# Patient Record
Sex: Female | Born: 1975 | Race: Black or African American | Hispanic: No | Marital: Single | Smoking: Never smoker
Health system: Southern US, Community
[De-identification: ages and names within clinical notes are randomized; demographics above are authoritative.]

## PROBLEM LIST (undated history)

## (undated) DIAGNOSIS — I10 Essential (primary) hypertension: Secondary | ICD-10-CM

## (undated) DIAGNOSIS — F32A Depression, unspecified: Secondary | ICD-10-CM

## (undated) DIAGNOSIS — F329 Major depressive disorder, single episode, unspecified: Secondary | ICD-10-CM

## (undated) DIAGNOSIS — F419 Anxiety disorder, unspecified: Secondary | ICD-10-CM

## (undated) HISTORY — DX: Anxiety disorder, unspecified: F41.9

## (undated) HISTORY — DX: Depression, unspecified: F32.A

## (undated) HISTORY — DX: Major depressive disorder, single episode, unspecified: F32.9

---

## 2014-02-11 ENCOUNTER — Emergency Department (HOSPITAL_COMMUNITY): Payer: Self-pay

## 2014-02-11 ENCOUNTER — Encounter (HOSPITAL_COMMUNITY): Payer: Self-pay | Admitting: Emergency Medicine

## 2014-02-11 ENCOUNTER — Emergency Department (HOSPITAL_COMMUNITY)
Admission: EM | Admit: 2014-02-11 | Discharge: 2014-02-11 | Disposition: A | Discharge status: Home / Self Care | Payer: Self-pay | Attending: Emergency Medicine | Admitting: Emergency Medicine

## 2014-02-11 DIAGNOSIS — R0602 Shortness of breath: Secondary | ICD-10-CM | POA: Insufficient documentation

## 2014-02-11 DIAGNOSIS — R112 Nausea with vomiting, unspecified: Secondary | ICD-10-CM | POA: Insufficient documentation

## 2014-02-11 DIAGNOSIS — R079 Chest pain, unspecified: Secondary | ICD-10-CM | POA: Insufficient documentation

## 2014-02-11 DIAGNOSIS — I10 Essential (primary) hypertension: Secondary | ICD-10-CM | POA: Insufficient documentation

## 2014-02-11 DIAGNOSIS — Z79899 Other long term (current) drug therapy: Secondary | ICD-10-CM | POA: Insufficient documentation

## 2014-02-11 DIAGNOSIS — R05 Cough: Secondary | ICD-10-CM | POA: Insufficient documentation

## 2014-02-11 HISTORY — DX: Essential (primary) hypertension: I10

## 2014-02-11 LAB — COMPREHENSIVE METABOLIC PANEL
ALT: 14 U/L (ref 0–35)
AST: 19 U/L (ref 0–37)
Albumin: 3.8 g/dL (ref 3.5–5.2)
Alkaline Phosphatase: 55 U/L (ref 39–117)
Anion gap: 13 (ref 5–15)
BILIRUBIN TOTAL: 0.5 mg/dL (ref 0.3–1.2)
BUN: 10 mg/dL (ref 6–23)
CHLORIDE: 99 meq/L (ref 96–112)
CO2: 25 mEq/L (ref 19–32)
CREATININE: 0.78 mg/dL (ref 0.50–1.10)
Calcium: 9.7 mg/dL (ref 8.4–10.5)
GFR calc Af Amer: >90 mL/min (ref >90)
GFR calc non Af Amer: >90 mL/min (ref >90)
Glucose, Bld: 112 mg/dL — ABNORMAL HIGH (ref 70–99)
Potassium: 3.9 mEq/L (ref 3.7–5.3)
Sodium: 137 mEq/L (ref 137–147)
Total Protein: 7.5 g/dL (ref 6.0–8.3)

## 2014-02-11 LAB — TROPONIN I: Troponin I: <0.3 ng/mL (ref <0.30)

## 2014-02-11 LAB — CBC WITH DIFFERENTIAL/PLATELET
BASOS PCT: 0 % (ref 0–1)
Basophils Absolute: 0 10*3/uL (ref 0.0–0.1)
EOS ABS: 0 10*3/uL (ref 0.0–0.7)
Eosinophils Relative: 0 % (ref 0–5)
HCT: 40.9 % (ref 36.0–46.0)
Hemoglobin: 13.4 g/dL (ref 12.0–15.0)
Lymphocytes Relative: 28 % (ref 12–46)
Lymphs Abs: 1.3 10*3/uL (ref 0.7–4.0)
MCH: 28.7 pg (ref 26.0–34.0)
MCHC: 32.8 g/dL (ref 30.0–36.0)
MCV: 87.6 fL (ref 78.0–100.0)
Monocytes Absolute: 0.4 10*3/uL (ref 0.1–1.0)
Monocytes Relative: 8 % (ref 3–12)
NEUTROS PCT: 64 % (ref 43–77)
Neutro Abs: 3 10*3/uL (ref 1.7–7.7)
PLATELETS: 376 10*3/uL (ref 150–400)
RBC: 4.67 MIL/uL (ref 3.87–5.11)
RDW: 13.2 % (ref 11.5–15.5)
WBC: 4.7 10*3/uL (ref 4.0–10.5)

## 2014-02-11 LAB — URINALYSIS, ROUTINE W REFLEX MICROSCOPIC
BILIRUBIN URINE: NEGATIVE
Glucose, UA: NEGATIVE mg/dL
KETONES UR: NEGATIVE mg/dL
NITRITE: NEGATIVE
Protein, ur: NEGATIVE mg/dL
SPECIFIC GRAVITY, URINE: 1.027 (ref 1.005–1.030)
Urobilinogen, UA: 1 mg/dL (ref 0.0–1.0)
pH: 7 (ref 5.0–8.0)

## 2014-02-11 LAB — URINE MICROSCOPIC-ADD ON

## 2014-02-11 MED ORDER — ASPIRIN 81 MG PO CHEW
324.0000 mg | CHEWABLE_TABLET | Freq: Once | ORAL | Status: AC
  Administered 2014-02-11: 324 mg via ORAL
  Filled 2014-02-11: qty 4

## 2014-02-11 NOTE — ED Notes (Signed)
Pt resting comfortably on stretcher, reports pain decreased at this time.  Denies needs/complaints.  Aware of plan for next blood draw at 1130.  NAD.

## 2014-02-11 NOTE — ED Provider Notes (Signed)
CSN: [id number]     Arrival date & time 02/11/14  0701 History   First MD Initiated Contact with Patient 02/11/14 [phone]     Chief Complaint  Patient presents with  . Chest Pain    intermittent xweeks     (Consider location/radiation/quality/duration/timing/severity/associated sxs/prior Treatment) Patient is a 38 y.o. female presenting with chest pain.  Chest Pain Pain location:  Substernal area Pain quality: pressure and sharp   Pain radiates to:  Does not radiate Pain radiates to the back: no   Pain severity:  Moderate Onset quality:  Gradual Duration:  4 hours Timing:  Constant Progression:  Unchanged Chronicity:  New Context comment:  Social stressors Relieved by:  Nothing Worsened by:  Nothing tried Ineffective treatments:  None tried Associated symptoms: cough, nausea, shortness of breath and vomiting   Associated symptoms: no abdominal pain, no fever and no weakness     Past Medical History  Diagnosis Date  . Hypertension    History reviewed. No pertinent past surgical history. No family history on file. History  Substance Use Topics  . Smoking status: Never Smoker   . Smokeless tobacco: Not on file  . Alcohol Use: Yes     Comment: rarely   OB History    No data available     Review of Systems  Constitutional: Negative for fever.  Respiratory: Positive for cough and shortness of breath.   Cardiovascular: Positive for chest pain.  Gastrointestinal: Positive for nausea and vomiting. Negative for abdominal pain.  Neurological: Negative for weakness.  All other systems reviewed and are negative.     Allergies  Review of patient's allergies indicates no known allergies.  Home Medications   Prior to Admission medications   Medication Sig Start Date End Date Taking? Authorizing Provider  ibuprofen (ADVIL,MOTRIN) 200 MG tablet Take 600 mg by mouth every 6 (six) hours as needed for headache.   Yes Historical Provider, MD  Omega-3 Fatty Acids (FISH OIL)  1000 MG CAPS Take 1,000 mg by mouth 3 (three) times daily.   Yes Historical Provider, MD   BP 144/87 mmHg  Pulse 93  Temp(Src) 98.1 F (36.7 C) (Oral)  Resp 18  SpO2 100% Physical Exam  Constitutional: She is oriented to person, place, and time. She appears well-developed and well-nourished.  HENT:  Head: Normocephalic and atraumatic.  Right Ear: External ear normal.  Left Ear: External ear normal.  Eyes: Conjunctivae and EOM are normal. Pupils are equal, round, and reactive to light.  Neck: Normal range of motion. Neck supple.  Cardiovascular: Normal rate, regular rhythm, normal heart sounds and intact distal pulses.   Pulmonary/Chest: Effort normal and breath sounds normal.  Abdominal: Soft. Bowel sounds are normal. There is no tenderness.  Musculoskeletal: Normal range of motion.  Neurological: She is alert and oriented to person, place, and time.  Skin: Skin is warm and dry.  Vitals reviewed.   ED Course  Procedures (including critical care time) Labs Review Labs Reviewed  COMPREHENSIVE METABOLIC PANEL - Abnormal; Notable for the following:    Glucose, Bld 112 (*)    All other components within normal limits  URINALYSIS, ROUTINE W REFLEX MICROSCOPIC - Abnormal; Notable for the following:    APPearance CLOUDY (*)    Hgb urine dipstick TRACE (*)    Leukocytes, UA SMALL (*)    All other components within normal limits  URINE MICROSCOPIC-ADD ON - Abnormal; Notable for the following:    Squamous Epithelial / LPF FEW (*)  All other components within normal limits  TROPONIN I  CBC WITH DIFFERENTIAL  TROPONIN I    Imaging Review Dg Chest 2 View  02/11/2014   CLINICAL DATA:  38 year old female with several day history of progressive left-sided chest pain and some shortness of breath  EXAM: CHEST  2 VIEW  COMPARISON:  None.  FINDINGS: The lungs are clear and negative for focal airspace consolidation, pulmonary edema or suspicious pulmonary nodule. No pleural effusion or  pneumothorax. Cardiac and mediastinal contours are within normal limits. No acute fracture or lytic or blastic osseous lesions. The visualized upper abdominal bowel gas pattern is unremarkable.  IMPRESSION: Negative chest x-ray.   Electronically Signed   By: Malachy MoanHeath  McCullough M.D.   On: 02/11/2014 07:58     EKG Interpretation   Date/Time:  Monday February 11 2014 07:11:49 EST Ventricular Rate:  98 PR Interval:  116 QRS Duration: 76 QT Interval:  341 QTC Calculation: 435 R Axis:   57 Text Interpretation:  Sinus rhythm Borderline short PR interval Probable  left atrial enlargement Borderline repolarization abnormality Baseline  wander in lead(s) V6 No old tracing to compare Confirmed by Mirian MoGentry,  Matthew [phone]([id number]) on 02/11/2014 7:18:25 AM      MDM   Final diagnoses:  Chest pain at rest    38 y.o. female with pertinent PMH of HTN presents with chest pain as described above. On arrival the patient has vital signs physical exam as above. Historically pain is atypical for ACS, began 4 hours prior to arrival. Patient was given aspirin. Initial EKG as above unremarkable.  Pt states that she has been under a great deal of emotional stress lately and in fact states she thinks this is the etiology of her symptoms.  Workup as above unremarkable including delta troponin.  Patient had no worsening of symptoms, GI symptoms, other pathology in the emergency department. She appeared relieved with negative workup and will follow-up with cardiology. Discharged home with standard return precautions  I have reviewed all laboratory and imaging studies if ordered as above  1. Chest pain at rest         Mirian MoMatthew Gentry, MD 02/11/14 1258

## 2014-02-11 NOTE — Discharge Instructions (Signed)

## 2014-02-11 NOTE — ED Notes (Signed)
Pt to CXR.

## 2014-02-11 NOTE — ED Notes (Signed)
Pt A+Ox4, reports intermittent L side chest pain xweeks, pt reports lots of social stressors "i think it's anxiety", pt reports occ SOB, occ nausea.  Worsening through the night tonight.  No aggravating or alleviating factors.  Skin PWD.  Speaking full/clear sentences, rr even/un-lab.  MAEI.  Self repositioning for comfort. SR on monitor, no ectopy.  NAD.

## 2014-02-18 ENCOUNTER — Emergency Department (HOSPITAL_COMMUNITY)
Admission: EM | Admit: 2014-02-18 | Discharge: 2014-02-18 | Disposition: A | Discharge status: Home / Self Care | Payer: Self-pay | Attending: Emergency Medicine | Admitting: Emergency Medicine

## 2014-02-18 ENCOUNTER — Emergency Department (HOSPITAL_COMMUNITY): Payer: Self-pay

## 2014-02-18 ENCOUNTER — Encounter (HOSPITAL_COMMUNITY): Payer: Self-pay | Admitting: *Deleted

## 2014-02-18 DIAGNOSIS — R0602 Shortness of breath: Secondary | ICD-10-CM | POA: Insufficient documentation

## 2014-02-18 DIAGNOSIS — I10 Essential (primary) hypertension: Secondary | ICD-10-CM | POA: Insufficient documentation

## 2014-02-18 DIAGNOSIS — R079 Chest pain, unspecified: Secondary | ICD-10-CM | POA: Insufficient documentation

## 2014-02-18 DIAGNOSIS — Z79899 Other long term (current) drug therapy: Secondary | ICD-10-CM | POA: Insufficient documentation

## 2014-02-18 LAB — I-STAT TROPONIN, ED: Troponin i, poc: 0 ng/mL (ref 0.00–0.08)

## 2014-02-18 LAB — BASIC METABOLIC PANEL
Anion gap: 5 (ref 5–15)
BUN: 8 mg/dL (ref 6–23)
CALCIUM: 9 mg/dL (ref 8.4–10.5)
CHLORIDE: 107 meq/L (ref 96–112)
CO2: 24 mmol/L (ref 19–32)
CREATININE: 0.71 mg/dL (ref 0.50–1.10)
GFR calc Af Amer: >90 mL/min (ref >90)
GFR calc non Af Amer: >90 mL/min (ref >90)
Glucose, Bld: 110 mg/dL — ABNORMAL HIGH (ref 70–99)
Potassium: 3.4 mmol/L — ABNORMAL LOW (ref 3.5–5.1)
Sodium: 136 mmol/L (ref 135–145)

## 2014-02-18 LAB — CBC
HEMATOCRIT: 39.1 % (ref 36.0–46.0)
Hemoglobin: 13.2 g/dL (ref 12.0–15.0)
MCH: 29 pg (ref 26.0–34.0)
MCHC: 33.8 g/dL (ref 30.0–36.0)
MCV: 85.9 fL (ref 78.0–100.0)
Platelets: 295 10*3/uL (ref 150–400)
RBC: 4.55 MIL/uL (ref 3.87–5.11)
RDW: 12.9 % (ref 11.5–15.5)
WBC: 5.3 10*3/uL (ref 4.0–10.5)

## 2014-02-18 MED ORDER — GI COCKTAIL ~~LOC~~
30.0000 mL | Freq: Once | ORAL | Status: AC
  Administered 2014-02-18: 30 mL via ORAL
  Filled 2014-02-18: qty 30

## 2014-02-18 MED ORDER — OMEPRAZOLE 20 MG PO CPDR: 20.0000 mg | DELAYED_RELEASE_CAPSULE | Freq: Every day | ORAL | Status: DC

## 2014-02-18 NOTE — ED Notes (Signed)
Pt states was seen about a week and a half ago for L sided chest pain, states nothing was found, states on/off since then has still been having the chest pain, states tonight woke up twice w/ jittery feeling, L sided chest pain, and sweaty, states she felt like her heart was racing, states woke up feeling short of breath also, denies n/v, states she felt tingling in hands but unsure if that was the way she was laying.

## 2014-02-18 NOTE — ED Notes (Signed)
Patient transported to X-ray 

## 2014-02-18 NOTE — Discharge Instructions (Signed)

## 2014-02-18 NOTE — ED Provider Notes (Signed)
CSN: [id number]     Arrival date & time 02/18/14  0206 History   First MD Initiated Contact with Patient 02/18/14 0304     Chief Complaint  Patient presents with  . Chest Pain     (Consider location/radiation/quality/duration/timing/severity/associated sxs/prior Treatment) Patient is a 38 y.o. female presenting with chest pain. The history is provided by the patient. No language interpreter was used.  Chest Pain Pain location:  L chest Pain quality: sharp   Pain radiates to:  Does not radiate Pain radiates to the back: no   Pain severity:  Moderate Associated symptoms: shortness of breath   Associated symptoms: no fever   Associated symptoms comment:  Patient returns to the ED with complaint of persistent chest pain like the pain she was evaluated for on 02/11/14. The pain is located under her left breast. She denies aggravating or alleviating factors.  She experiences the chest pain everyday, episodically. No fever, nausea, vomiting, diaphoresis.    Past Medical History  Diagnosis Date  . Hypertension    History reviewed. No pertinent past surgical history. No family history on file. History  Substance Use Topics  . Smoking status: Never Smoker   . Smokeless tobacco: Not on file  . Alcohol Use: Yes     Comment: rarely   OB History    No data available     Review of Systems  Constitutional: Negative for fever and chills.  HENT: Negative.   Respiratory: Positive for shortness of breath.   Cardiovascular: Positive for chest pain.  Gastrointestinal: Negative.   Musculoskeletal: Negative.   Skin: Negative.   Neurological: Negative.       Allergies  Review of patient's allergies indicates no known allergies.  Home Medications   Prior to Admission medications   Medication Sig Start Date End Date Taking? Authorizing Provider  ibuprofen (ADVIL,MOTRIN) 200 MG tablet Take 600 mg by mouth every 6 (six) hours as needed for headache.   Yes Historical Provider, MD   Omega-3 Fatty Acids (FISH OIL) 1000 MG CAPS Take 1,000 mg by mouth 3 (three) times daily.   Yes Historical Provider, MD   BP 135/91 mmHg  Pulse 94  Temp(Src) 98.4 F (36.9 C) (Oral)  Resp 20  Ht 5\' 2"  (1.575 m)  Wt 180 lb (81.647 kg)  BMI 32.91 kg/m2  SpO2 97% Physical Exam  Constitutional: She is oriented to person, place, and time. She appears well-developed and well-nourished.  HENT:  Head: Normocephalic.  Eyes: Conjunctivae are normal.  Neck: Normal range of motion. Neck supple.  Cardiovascular: Normal rate and regular rhythm.   Pulmonary/Chest: Effort normal and breath sounds normal.  Abdominal: Soft. Bowel sounds are normal. There is no tenderness. There is no rebound and no guarding.  Musculoskeletal: Normal range of motion.  Neurological: She is alert and oriented to person, place, and time.  Skin: Skin is warm and dry. No rash noted.  Psychiatric: She has a normal mood and affect.    ED Course  Procedures (including critical care time) Labs Review Labs Reviewed  CBC  BASIC METABOLIC PANEL  I-STAT TROPOININ, ED    Imaging Review No results found.   EKG Interpretation   Date/Time:  Monday February 18 2014 02:15:38 EST Ventricular Rate:  98 PR Interval:  119 QRS Duration: 76 QT Interval:  340 QTC Calculation: 434 R Axis:   54 Text Interpretation:  Sinus rhythm Borderline short PR interval Borderline  T wave abnormalities No significant change was found Confirmed by MOLPUS  MD, Jonny RuizJOHN ([id number]) on 02/18/2014 3:27:35 AM      MDM   Final diagnoses:  Chest pain    1. Noncardiac chest pain  Lab evaluation and EKG are again negative for evidence of cardiogenic chest pain. She is well appearing. She has been given outpatient referral resources. She is stable for discharge home.     Arnoldo HookerShari A Xaden Kaufman, PA-C 02/18/14 0503  Hanley SeamenJohn L Molpus, MD 02/18/14 [phone]

## 2014-02-26 ENCOUNTER — Encounter: Payer: Self-pay | Admitting: Internal Medicine

## 2014-02-26 ENCOUNTER — Other Ambulatory Visit: Payer: Self-pay

## 2014-02-26 ENCOUNTER — Ambulatory Visit (HOSPITAL_COMMUNITY)
Admission: RE | Admit: 2014-02-26 | Discharge: 2014-02-26 | Disposition: A | Discharge status: Home / Self Care | Payer: Self-pay | Source: Ambulatory Visit | Attending: Internal Medicine | Admitting: Internal Medicine

## 2014-02-26 ENCOUNTER — Ambulatory Visit: Payer: Self-pay | Attending: Internal Medicine | Admitting: Internal Medicine

## 2014-02-26 VITALS — BP 130/80 | HR 92 | Temp 98.0°F | Resp 16 | Wt 178.4 lb

## 2014-02-26 DIAGNOSIS — F419 Anxiety disorder, unspecified: Secondary | ICD-10-CM | POA: Insufficient documentation

## 2014-02-26 DIAGNOSIS — F32A Depression, unspecified: Secondary | ICD-10-CM

## 2014-02-26 DIAGNOSIS — R0789 Other chest pain: Secondary | ICD-10-CM | POA: Insufficient documentation

## 2014-02-26 DIAGNOSIS — L739 Follicular disorder, unspecified: Secondary | ICD-10-CM

## 2014-02-26 DIAGNOSIS — F418 Other specified anxiety disorders: Secondary | ICD-10-CM

## 2014-02-26 DIAGNOSIS — Z139 Encounter for screening, unspecified: Secondary | ICD-10-CM

## 2014-02-26 DIAGNOSIS — F329 Major depressive disorder, single episode, unspecified: Secondary | ICD-10-CM | POA: Insufficient documentation

## 2014-02-26 DIAGNOSIS — I10 Essential (primary) hypertension: Secondary | ICD-10-CM

## 2014-02-26 LAB — COMPLETE METABOLIC PANEL WITH GFR
ALBUMIN: 4 g/dL (ref 3.5–5.2)
ALK PHOS: 50 U/L (ref 39–117)
ALT: 10 U/L (ref 0–35)
AST: 13 U/L (ref 0–37)
BILIRUBIN TOTAL: 0.3 mg/dL (ref 0.2–1.2)
BUN: 10 mg/dL (ref 6–23)
CO2: 28 meq/L (ref 19–32)
Calcium: 9.4 mg/dL (ref 8.4–10.5)
Chloride: 102 mEq/L (ref 96–112)
Creat: 0.99 mg/dL (ref 0.50–1.10)
GFR, EST NON AFRICAN AMERICAN: 72 mL/min
GFR, Est African American: 82 mL/min
Glucose, Bld: 95 mg/dL (ref 70–99)
Potassium: 4.4 mEq/L (ref 3.5–5.3)
Sodium: 138 mEq/L (ref 135–145)
TOTAL PROTEIN: 7 g/dL (ref 6.0–8.3)

## 2014-02-26 LAB — LIPID PANEL
Cholesterol: 195 mg/dL (ref 0–200)
HDL: 47 mg/dL (ref >39)
LDL Cholesterol: 133 mg/dL — ABNORMAL HIGH (ref 0–99)
TRIGLYCERIDES: 73 mg/dL (ref <150)
Total CHOL/HDL Ratio: 4.1 Ratio
VLDL: 15 mg/dL (ref 0–40)

## 2014-02-26 LAB — TSH: TSH: 0.502 u[IU]/mL (ref 0.350–4.500)

## 2014-02-26 MED ORDER — CEPHALEXIN 500 MG PO CAPS: 500.0000 mg | ORAL_CAPSULE | Freq: Three times a day (TID) | ORAL | Status: DC

## 2014-02-26 MED ORDER — CITALOPRAM HYDROBROMIDE 10 MG PO TABS: 10.0000 mg | ORAL_TABLET | Freq: Every day | ORAL | Status: AC

## 2014-02-26 NOTE — Progress Notes (Signed)
Patient Demographics  Margaret West, is a 39 y.o. female  ZOX:[id number]  [id number]  DOB - [birth year]  CC:  Chief Complaint  Patient presents with  . Establish Care       HPI: Margaret West is a 39 y.o. female here today to establish medical care.Patient went to the emergency room last month her 2 times with symptoms of chest pain, EMR reviewed, patient had EKG done troponin stump which were negative for acute abnormality, had some borderline short PR interval sinus rhythm minimal T-wave abnormalities, the chest pain was thought to be noncardiac, she was prescribed Prilosec, she's not taking any medication, patient reported to have been under a lot of stress sometimes feels anxious and racing of the heart her, she has moved her from [place] and currently in the process to find a job, she does report change in her appetite and sleep, she denies any SI or HI, she is willing to try SSRI, she does have history of hypertension but has been not on any medication for the last one year today her blood pressure manually is 130/80. EKG done today is unchanged.patient also reported to noticed a boil on her left arm for the last one week as per patient she noticed some discharge currently has minimal tenderness Patient has No headache, No chest pain, No abdominal pain - No Nausea, No new weakness tingling or numbness, No Cough - SOB.  No Known Allergies Past Medical History  Diagnosis Date  . Hypertension    Current Outpatient Prescriptions on File Prior to Visit  Medication Sig Dispense Refill  . ibuprofen (ADVIL,MOTRIN) 200 MG tablet Take 600 mg by mouth every 6 (six) hours as needed for headache.    . Omega-3 Fatty Acids (FISH OIL) 1000 MG CAPS Take 1,000 mg by mouth 3 (three) times daily.     No current facility-administered medications on file prior to visit.   Family History  Problem Relation Age of Onset  . Cancer Mother     cervical cancer   . Hypertension  Maternal Grandmother   . Cancer Maternal Grandmother     lung cancer    History   Social History  . Marital Status: Single    Spouse Name: N/A    Number of Children: N/A  . Years of Education: N/A   Occupational History  . Not on file.   Social History Main Topics  . Smoking status: Never Smoker   . Smokeless tobacco: Not on file  . Alcohol Use: 0.0 oz/week    0 Not specified per week     Comment: rarely  . Drug Use: No  . Sexual Activity: Yes    Birth Control/ Protection: None   Other Topics Concern  . Not on file   Social History Narrative    Review of Systems: Constitutional: Negative for fever, chills, diaphoresis, activity change, appetite change and fatigue. HENT: Negative for ear pain, nosebleeds, congestion, facial swelling, rhinorrhea, neck pain, neck stiffness and ear discharge.  Eyes: Negative for pain, discharge, redness, itching and visual disturbance. Respiratory: Negative for cough, choking, chest tightness, shortness of breath, wheezing and stridor.  Cardiovascular: Negative for chest pain, palpitations and leg swelling. Gastrointestinal: Negative for abdominal distention. Genitourinary: Negative for dysuria, urgency, frequency, hematuria, flank pain, decreased urine volume, difficulty urinating and dyspareunia.  Musculoskeletal: Negative for back pain, joint swelling, arthralgia and gait problem. Neurological: Negative for dizziness, tremors, seizures, syncope, facial asymmetry, speech difficulty, weakness, light-headedness, numbness and headaches.  Hematological: Negative for adenopathy. Does not bruise/bleed easily. Psychiatric/Behavioral: Negative for hallucinations, behavioral problems, confusion, dysphoric mood, decreased concentration and agitation.    Objective:   Filed Vitals:   02/26/14 1457  BP: 130/80  Pulse:   Temp:   Resp:     Physical Exam: Constitutional: Patient appears well-developed and well-nourished. No distress. HENT:  Normocephalic, atraumatic, External right and left ear normal. Oropharynx is clear and moist.  Eyes: Conjunctivae and EOM are normal. PERRLA, no scleral icterus. Neck: Normal ROM. Neck supple. No JVD. No tracheal deviation. No thyromegaly. CVS: RRR, S1/S2 +, no murmurs, no gallops, no carotid bruit.  Pulmonary: Effort and breath sounds normal, no stridor, rhonchi, wheezes, rales.  Abdominal: Soft. BS +, no distension, tenderness, rebound or guarding.  Musculoskeletal: Normal range of motion. No edema and no tenderness.  Neuro: Alert. Normal reflexes, muscle tone coordination. No cranial nerve deficit. Skin: Skin is warm and dry. No rash noted. Not diaphoretic. No erythema. No pallor. Psychiatric:anxious tearful, judgment, thought content normal.  Lab Results  Component Value Date   WBC 5.3 02/18/2014   HGB 13.2 02/18/2014   HCT 39.1 02/18/2014   MCV 85.9 02/18/2014   PLT 295 02/18/2014   Lab Results  Component Value Date   CREATININE 0.71 02/18/2014   BUN 8 02/18/2014   NA 136 02/18/2014   K 3.4* 02/18/2014   CL 107 02/18/2014   CO2 24 02/18/2014    No results found for: HGBA1C Lipid Panel  No results found for: CHOL, TRIG, HDL, CHOLHDL, VLDL, LDLCALC     Assessment and plan:   1. Essential hypertension Manual blood pressure is 130/80, have advised patient for diet modification, reevaluate on next visit. If the blood pressure trends up will consider starting on medication. - COMPLETE METABOLIC PANEL WITH GFR  2. Chest pain, atypical EKG no acute abnormalities compared to previous EKG , patient likely has symptoms of anxiety.  3. Screening Ordered baseline blood work. - TSH - Lipid panel - Vit D  25 hydroxy (rtn osteoporosis monitoring) - Hemoglobin A1c - MM DIGITAL SCREENING BILATERAL; Future - Ambulatory referral to Obstetrics / Gynecology  4. Anxiety and depression Started patient on low-dose Celexa, advise patient to get immediate medical attention if she  develops any symptoms of SI or HI, she understands verbalized instructions. - citalopram (CELEXA) 10 MG tablet; Take 1 tablet (10 mg total) by mouth daily.  Dispense: 30 tablet; Refill: 3  5. Folliculitis  - cephALEXin (KEFLEX) 500 MG capsule; Take 1 capsule (500 mg total) by mouth 3 (three) times daily.  Dispense: 21 capsule; Refill: 0     Health Maintenance  -Pap Smear: referred to GYN -Mammogram: ordered  -Vaccinations:  uptodate with the flu shot   Return in about 3 months (around 05/28/2014), or if symptoms worsen or fail to improve, for hypertension, anxiety.Marland Kitchen.     Doris CheadleADVANI, Vincent Ehrler, MD

## 2014-02-26 NOTE — Progress Notes (Signed)
Patient here to establish care Complains of intermittent chest pains that feels Heavy at times Sometimes has the feeling her heart is racing Patient complains of a sore to her left elbow that has been having Some mild discharge

## 2014-02-26 NOTE — Patient Instructions (Signed)
DASH Eating Plan °DASH stands for "Dietary Approaches to Stop Hypertension." The DASH eating plan is a healthy eating plan that has been shown to reduce high blood pressure (hypertension). Additional health benefits may include reducing the risk of type 2 diabetes mellitus, heart disease, and stroke. The DASH eating plan may also help with weight loss. °WHAT DO I NEED TO KNOW ABOUT THE DASH EATING PLAN? °For the DASH eating plan, you will follow these general guidelines: °· Choose foods with a percent daily value for sodium of less than 5% (as listed on the food label). °· Use salt-free seasonings or herbs instead of table salt or sea salt. °· Check with your health care provider or pharmacist before using salt substitutes. °· Eat lower-sodium products, often labeled as "lower sodium" or "no salt added." °· Eat fresh foods. °· Eat more vegetables, fruits, and low-fat dairy products. °· Choose whole grains. Look for the word "whole" as the first word in the ingredient list. °· Choose fish and skinless chicken or turkey more often than red meat. Limit fish, poultry, and meat to 6 oz (170 g) each day. °· Limit sweets, desserts, sugars, and sugary drinks. °· Choose heart-healthy fats. °· Limit cheese to 1 oz (28 g) per day. °· Eat more home-cooked food and less restaurant, buffet, and fast food. °· Limit fried foods. °· Cook foods using methods other than frying. °· Limit canned vegetables. If you do use them, rinse them well to decrease the sodium. °· When eating at a restaurant, ask that your food be prepared with less salt, or no salt if possible. °WHAT FOODS CAN I EAT? °Seek help from a dietitian for individual calorie needs. °Grains °Whole grain or whole wheat bread. Brown rice. Whole grain or whole wheat pasta. Quinoa, bulgur, and whole grain cereals. Low-sodium cereals. Corn or whole wheat flour tortillas. Whole grain cornbread. Whole grain crackers. Low-sodium crackers. °Vegetables °Fresh or frozen vegetables  (raw, steamed, roasted, or grilled). Low-sodium or reduced-sodium tomato and vegetable juices. Low-sodium or reduced-sodium tomato sauce and paste. Low-sodium or reduced-sodium canned vegetables.  °Fruits °All fresh, canned (in natural juice), or frozen fruits. °Meat and Other Protein Products °Ground beef (85% or leaner), grass-fed beef, or beef trimmed of fat. Skinless chicken or turkey. Ground chicken or turkey. Pork trimmed of fat. All fish and seafood. Eggs. Dried beans, peas, or lentils. Unsalted nuts and seeds. Unsalted canned beans. °Dairy °Low-fat dairy products, such as skim or 1% milk, 2% or reduced-fat cheeses, low-fat ricotta or cottage cheese, or plain low-fat yogurt. Low-sodium or reduced-sodium cheeses. °Fats and Oils °Tub margarines without trans fats. Light or reduced-fat mayonnaise and salad dressings (reduced sodium). Avocado. Safflower, olive, or canola oils. Natural peanut or almond butter. °Other °Unsalted popcorn and pretzels. °The items listed above may not be a complete list of recommended foods or beverages. Contact your dietitian for more options. °WHAT FOODS ARE NOT RECOMMENDED? °Grains °White bread. White pasta. White rice. Refined cornbread. Bagels and croissants. Crackers that contain trans fat. °Vegetables °Creamed or fried vegetables. Vegetables in a cheese sauce. Regular canned vegetables. Regular canned tomato sauce and paste. Regular tomato and vegetable juices. °Fruits °Dried fruits. Canned fruit in light or heavy syrup. Fruit juice. °Meat and Other Protein Products °Fatty cuts of meat. Ribs, chicken wings, bacon, sausage, bologna, salami, chitterlings, fatback, hot dogs, bratwurst, and packaged luncheon meats. Salted nuts and seeds. Canned beans with salt. °Dairy °Whole or 2% milk, cream, half-and-half, and cream cheese. Whole-fat or sweetened yogurt. Full-fat   cheeses or blue cheese. Nondairy creamers and whipped toppings. Processed cheese, cheese spreads, or cheese  curds. °Condiments °Onion and garlic salt, seasoned salt, table salt, and sea salt. Canned and packaged gravies. Worcestershire sauce. Tartar sauce. Barbecue sauce. Teriyaki sauce. Soy sauce, including reduced sodium. Steak sauce. Fish sauce. Oyster sauce. Cocktail sauce. Horseradish. Ketchup and mustard. Meat flavorings and tenderizers. Bouillon cubes. Hot sauce. Tabasco sauce. Marinades. Taco seasonings. Relishes. °Fats and Oils °Butter, stick margarine, lard, shortening, ghee, and bacon fat. Coconut, palm kernel, or palm oils. Regular salad dressings. °Other °Pickles and olives. Salted popcorn and pretzels. °The items listed above may not be a complete list of foods and beverages to avoid. Contact your dietitian for more information. °WHERE CAN I FIND MORE INFORMATION? °National [employer], [employer], [employer]: www.nhlbi.nih.gov/health/health-topics/topics/dash/ °Document Released: 01/28/2011 Document Revised: 06/25/2013 Document Reviewed: 12/13/2012 °Exit[employer]® Patient Information ©2015 [employer]. This information is not intended to replace advice given to you by your health care provider. Make sure you discuss any questions you have with your health care provider. ° °

## 2014-02-27 LAB — VITAMIN D 25 HYDROXY (VIT D DEFICIENCY, FRACTURES): Vit D, 25-Hydroxy: 9 ng/mL — ABNORMAL LOW (ref 30–100)

## 2014-02-27 LAB — HEMOGLOBIN A1C
HEMOGLOBIN A1C: 5.9 % — AB (ref <5.7)
Mean Plasma Glucose: 123 mg/dL — ABNORMAL HIGH (ref <117)

## 2014-02-28 ENCOUNTER — Telehealth: Payer: Self-pay

## 2014-02-28 MED ORDER — VITAMIN D (ERGOCALCIFEROL) 1.25 MG (50000 UNIT) PO CAPS: 50000.0000 [IU] | ORAL_CAPSULE | ORAL | Status: DC

## 2014-02-28 NOTE — Telephone Encounter (Signed)
Patient is aware of her lab results Prescription for vitamin D sent to [facility] on [address]

## 2014-02-28 NOTE — Telephone Encounter (Signed)
-----   Message from Doris Cheadleeepak Advani, MD sent at 02/27/2014  9:11 AM EST ----- Blood work reviewed, noticed low vitamin D, call patient advise to start ergocalciferol 50,000 units once a week for the duration of  12 weeks. Blood work reviewed noticed hemoglobin A1c of 5.9%, patient has prediabetes, call and advise patient for low carbohydrate diet.

## 2014-03-14 ENCOUNTER — Encounter: Payer: Self-pay | Admitting: Obstetrics and Gynecology

## 2014-03-14 ENCOUNTER — Ambulatory Visit: Payer: Self-pay | Attending: Family Medicine | Admitting: Obstetrics and Gynecology

## 2014-03-14 VITALS — BP 151/88 | HR 94 | Temp 98.1°F | Wt 178.8 lb

## 2014-03-14 DIAGNOSIS — Z124 Encounter for screening for malignant neoplasm of cervix: Secondary | ICD-10-CM

## 2014-03-14 NOTE — Progress Notes (Unsigned)
Patient ID: Margaret ChannelDarshika West, female   DOB: 1975/12/28, 39 y.o.   MRN: [id number] Gynecological exam. Patient is a 39 year old gravida 3 para 3 black female. Her previous NovaSure ablation therefore is amenorrheic. Previous bilateral tubal ligation. She is sexually active with no discomfort. She has no urinary or bowel complaints. Doing self breast exams no abnormalities have been noted  On exam: Abdomen is benign. Is a well-healed low transverse incision from prior cesarean sections.                  Pelvic exam: Normal external genital. Vaginal mucosa is clear. Cervix is unremarkable. Uterus is of normal size shape and contour. Adnexa are are unremarkable.   Impression: Normal gynecological exam  Plan: Routine follow-up.                                                                                               Raphael GibneyJohn S Joellen Tullos M.D.

## 2014-03-20 LAB — CYTOLOGY - PAP

## 2014-05-20 ENCOUNTER — Ambulatory Visit (INDEPENDENT_AMBULATORY_CARE_PROVIDER_SITE_OTHER): Payer: 59 | Admitting: Family Medicine

## 2014-05-20 VITALS — BP 118/80 | HR 97 | Temp 98.2°F | Ht 64.0 in | Wt 180.1 lb

## 2014-05-20 DIAGNOSIS — H6981 Other specified disorders of Eustachian tube, right ear: Secondary | ICD-10-CM | POA: Diagnosis not present

## 2014-05-20 DIAGNOSIS — H9201 Otalgia, right ear: Secondary | ICD-10-CM

## 2014-05-20 MED ORDER — PREDNISONE 20 MG PO TABS: ORAL_TABLET | ORAL | Status: AC

## 2014-05-20 MED ORDER — FLUTICASONE PROPIONATE 50 MCG/ACT NA SUSP: 2.0000 | Freq: Every day | NASAL | Status: AC

## 2014-05-20 NOTE — Progress Notes (Signed)
Subjective: 39 year old lady who has been having problems over the last month with the right ear paining intermittently. She has had a little stuffiness and popping of it. She has had a little trouble with the left ear also. She has not had a cold or other major symptoms. She does have a history of high blood pressure and high cholesterol. She does not smoke. She works at a call center. She moved [place] from [place]. She had some problem with her ears back over a year ago when she was down there.  Objective: Pleasant lady in no major distress. TMs are entirely normal in appearance. No pain on movement of the ear lobe or movement of the TMJ joint. Her throat was clear. Neck supple without significant nodes.   Assessment: Right otalgia Probable eustachian tube dysfunction  Plan: Give a few days of prednisone to try and start the process of opening up the eustachian tubes. Use fluticasone spray as directed. If not improving might need to be referred to an ENT, but hopefully this will do the job.

## 2014-05-20 NOTE — Patient Instructions (Addendum)
Use the fluticasone nose spray 2 sprays each nostril twice daily for 3 days, then reduce to once daily  Take the prednisone 3 pills daily for 3 days. Best taken after breakfast. If taken late in the day sometimes makes sleep restless  Take an over-the-counter antihistamine such as Allegra (fexofenadine) or Claritin (loratadine) one daily  Return if worse or not improving. Might need to be referred to an ENT specialist if symptoms persist  Barotitis Media Barotitis media is inflammation of your middle ear. This occurs when the auditory tube (eustachian tube) leading from the back of your nose (nasopharynx) to your eardrum is blocked. This blockage may result from a cold, environmental allergies, or an upper respiratory infection. Unresolved barotitis media may lead to damage or hearing loss (barotrauma), which may become permanent. HOME CARE INSTRUCTIONS   Use medicines as recommended by your health care provider. Over-the-counter medicines will help unblock the canal and can help during times of air travel.  Do not put anything into your ears to clean or unplug them. Eardrops will not be helpful.  Do not swim, dive, or fly until your health care provider says it is all right to do so. If these activities are necessary, chewing gum with frequent, forceful swallowing may help. It is also helpful to hold your nose and gently blow to pop your ears for equalizing pressure changes. This forces air into the eustachian tube.  Only take over-the-counter or prescription medicines for pain, discomfort, or fever as directed by your health care provider.  A decongestant may be helpful in decongesting the middle ear and make pressure equalization easier. SEEK MEDICAL CARE IF:  You experience a serious form of dizziness in which you feel as if the room is spinning and you feel nauseated (vertigo).  Your symptoms only involve one ear. SEEK IMMEDIATE MEDICAL CARE IF:   You develop a severe headache,  dizziness, or severe ear pain.  You have bloody or pus-like drainage from your ears.  You develop a fever.  Your problems do not improve or become worse. MAKE SURE YOU:   Understand these instructions.  Will watch your condition.  Will get help right away if you are not doing well or get worse. Document Released: 02/06/2000 Document Revised: 11/29/2012 Document Reviewed: 09/05/2012 [facility] Patient Information 2015 [place]. This information is not intended to replace advice given to you by your health care provider. Make sure you discuss any questions you have with your health care provider.

## 2014-07-01 ENCOUNTER — Encounter (HOSPITAL_COMMUNITY): Payer: Self-pay | Admitting: General Practice

## 2014-07-01 ENCOUNTER — Emergency Department (HOSPITAL_COMMUNITY)
Admission: EM | Admit: 2014-07-01 | Discharge: 2014-07-01 | Disposition: A | Discharge status: Home / Self Care | Payer: 59 | Attending: Emergency Medicine | Admitting: Emergency Medicine

## 2014-07-01 DIAGNOSIS — Z7952 Long term (current) use of systemic steroids: Secondary | ICD-10-CM | POA: Insufficient documentation

## 2014-07-01 DIAGNOSIS — F419 Anxiety disorder, unspecified: Secondary | ICD-10-CM | POA: Diagnosis not present

## 2014-07-01 DIAGNOSIS — F329 Major depressive disorder, single episode, unspecified: Secondary | ICD-10-CM | POA: Diagnosis not present

## 2014-07-01 DIAGNOSIS — Z79899 Other long term (current) drug therapy: Secondary | ICD-10-CM | POA: Diagnosis not present

## 2014-07-01 DIAGNOSIS — I10 Essential (primary) hypertension: Secondary | ICD-10-CM | POA: Diagnosis not present

## 2014-07-01 DIAGNOSIS — F410 Panic disorder [episodic paroxysmal anxiety] without agoraphobia: Secondary | ICD-10-CM

## 2014-07-01 DIAGNOSIS — Z7951 Long term (current) use of inhaled steroids: Secondary | ICD-10-CM | POA: Insufficient documentation

## 2014-07-01 DIAGNOSIS — R079 Chest pain, unspecified: Secondary | ICD-10-CM | POA: Diagnosis present

## 2014-07-01 LAB — BASIC METABOLIC PANEL
Anion gap: 9 (ref 5–15)
BUN: 10 mg/dL (ref 6–20)
CHLORIDE: 105 mmol/L (ref 101–111)
CO2: 23 mmol/L (ref 22–32)
CREATININE: 0.94 mg/dL (ref 0.44–1.00)
Calcium: 9.2 mg/dL (ref 8.9–10.3)
GFR calc non Af Amer: >60 mL/min (ref >60)
Glucose, Bld: 102 mg/dL — ABNORMAL HIGH (ref 70–99)
Potassium: 3.8 mmol/L (ref 3.5–5.1)
Sodium: 137 mmol/L (ref 135–145)

## 2014-07-01 LAB — I-STAT TROPONIN, ED: Troponin i, poc: 0 ng/mL (ref 0.00–0.08)

## 2014-07-01 LAB — CBC
HCT: 40.1 % (ref 36.0–46.0)
Hemoglobin: 13.6 g/dL (ref 12.0–15.0)
MCH: 29.7 pg (ref 26.0–34.0)
MCHC: 33.9 g/dL (ref 30.0–36.0)
MCV: 87.6 fL (ref 78.0–100.0)
Platelets: 320 10*3/uL (ref 150–400)
RBC: 4.58 MIL/uL (ref 3.87–5.11)
RDW: 13 % (ref 11.5–15.5)
WBC: 6.1 10*3/uL (ref 4.0–10.5)

## 2014-07-01 MED ORDER — DIAZEPAM 5 MG PO TABS: 5.0000 mg | ORAL_TABLET | Freq: Two times a day (BID) | ORAL | Status: AC | PRN
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
## 2014-07-01 NOTE — ED Notes (Signed)
Pt complaining of intermittent chest pain that started last week. Pt describes chest pain as a tightness. Pt reports chest pain in left chest wall, and is worse with movement. Pt has a history of anxiety. Pt is A/O. Pt reports nausea with no vomitting.

## 2014-07-01 NOTE — Discharge Instructions (Signed)
Read the information below.  Use the prescribed medication as directed.  Please discuss all new medications with your pharmacist.  You may return to the Emergency Department at any time for worsening condition or any new symptoms that concern you.  If there is any possibility that you might be pregnant, please let your health care provider know and discuss this with the pharmacist to ensure medication safety.    If you develop worsening chest pain, shortness of breath, fever, you pass out, or become weak or dizzy, return to the ER for a recheck.     Panic Attacks Panic attacks are sudden, short-livedsurges of severe anxiety, fear, or discomfort. They may occur for no reason when you are relaxed, when you are anxious, or when you are sleeping. Panic attacks may occur for a number of reasons:   Healthy people occasionally have panic attacks in extreme, life-threatening situations, such as war or natural disasters. Normal anxiety is a protective mechanism of the body that helps us react to danger (fight or flight response).  Panic attacks are often seen with anxiety disorders, such as panic disorder, social anxiety disorder, generalized anxiety disorder, and phobias. Anxiety disorders cause excessive or uncontrollable anxiety. They may interfere with your relationships or other life activities.  Panic attacks are sometimes seen with other mental illnesses, such as depression and posttraumatic stress disorder.  Certain medical conditions, prescription medicines, and drugs of abuse can cause panic attacks. SYMPTOMS  Panic attacks start suddenly, peak within 20 minutes, and are accompanied by four or more of the following symptoms:  Pounding heart or fast heart rate (palpitations).  Sweating.  Trembling or shaking.  Shortness of breath or feeling smothered.  Feeling choked.  Chest pain or discomfort.  Nausea or strange feeling in your stomach.  Dizziness, light-headedness, or feeling like  you will faint.  Chills or hot flushes.  Numbness or tingling in your lips or hands and feet.  Feeling that things are not real or feeling that you are not yourself.  Fear of losing control or going crazy.  Fear of dying. Some of these symptoms can mimic serious medical conditions. For example, you may think you are having a heart attack. Although panic attacks can be very scary, they are not life threatening. DIAGNOSIS  Panic attacks are diagnosed through an assessment by your health care provider. Your health care provider will ask questions about your symptoms, such as where and when they occurred. Your health care provider will also ask about your medical history and use of alcohol and drugs, including prescription medicines. Your health care provider may order blood tests or other studies to rule out a serious medical condition. Your health care provider may refer you to a mental health professional for further evaluation. TREATMENT   Most healthy people who have one or two panic attacks in an extreme, life-threatening situation will not require treatment.  The treatment for panic attacks associated with anxiety disorders or other mental illness typically involves counseling with a mental health professional, medicine, or a combination of both. Your health care provider will help determine what treatment is best for you.  Panic attacks due to physical illness usually go away with treatment of the illness. If prescription medicine is causing panic attacks, talk with your health care provider about stopping the medicine, decreasing the dose, or substituting another medicine.  Panic attacks due to alcohol or drug abuse go away with abstinence. Some adults need professional help in order to stop drinking or  using drugs. HOME CARE INSTRUCTIONS   Take all medicines as directed by your health care provider.   Schedule and attend follow-up visits as directed by your health care provider. It  is important to keep all your appointments. SEEK MEDICAL CARE IF:  You are not able to take your medicines as prescribed.  Your symptoms do not improve or get worse. SEEK IMMEDIATE MEDICAL CARE IF:   You experience panic attack symptoms that are different than your usual symptoms.  You have serious thoughts about hurting yourself or others.  You are taking medicine for panic attacks and have a serious side effect. MAKE SURE YOU:  Understand these instructions.  Will watch your condition.  Will get help right away if you are not doing well or get worse. Document Released: 02/08/2005 Document Revised: 02/13/2013 Document Reviewed: 09/22/2012 [facility] Patient Information 2015 [place], [place]. This information is not intended to replace advice given to you by your health care provider. Make sure you discuss any questions you have with your health care provider.   Behavioral Health Resources in the [facility]  Intensive Outpatient Programs: [facility]      [address] [place], [place] [phone] Both a day and evening program       [facility] Outpatient     [address]        [place], [place] [address] [phone]         ADS: [facility] [address] [place] [phone]  [facility] Mental Health ACCESS LINE: [phone] or [phone] [address] [place], [place] [address] [email or web address]  Mobile Crisis Teams:                                        [facility]         Mobile Crisis Care Unit [phone]             [facility] [address] Ginette Otto [phone]                                         Interventionist [address] [place] [phone]  Self-Help/Support Groups: Mental Health [facility]oc[facility] of [employer] of support groups [phone] (call for more  info)  Narcotics Anonymous (NA) Caring [facility] [address] [place] - 2 meetings at this location  Residential Treatment Programs:  [facility]      [address]        [place]       [phone]         [facility] [address] [place] [address] [place], [place]  [phone]  [facility]  [address] [place], [place] [address] [phone] Admissions: 8am-3pm M-F  [facility]     [address]        [place], [place] [address]       [phone]         [facility] [address] [place], [place] [phone]  [facility] [address] [place], [place] [phone]  [facility] - Intensive Outpatient      [address]     [place],  [place]       [phone]         [facility]     [address] [place], [place] [phone] or [phone]  [facility] ([facility])  [address] [place], [place] [phone]  [facility] [address]                                               [address] [place] [phone]  [facility] Resources: [phone]- [phone]               General Therapy                                                Angie FavaJulie Brannon, PhD        [address]                                       [place], [place] [address]         [phone]   Insurance  [facility]   [address] [place], [place] [id number] [phone]  [facility] [address] [place], [place] [address] [phone] Insurance/Medicaid/sponsorship through [facility]  [facility] Families                                              [address]                                        [place], [place] [id number]    Therapy/tele-psych/case         [phone]          [facility] [address]   [place], [place]  [phone]   Adolescent/group home/case management [phone]                                           Creola CornJulia Brannon PhD       General therapy       Insurance   [id number]         Dr. Lolly MustacheArfeen Insurance [id number]- [phone] M-F  [facility] Medicaid, sponsorship [phone]

## 2015-04-15 ENCOUNTER — Other Ambulatory Visit (HOSPITAL_COMMUNITY)
Admission: RE | Admit: 2015-04-15 | Discharge: 2015-04-15 | Disposition: A | Discharge status: Home / Self Care | Payer: 59 | Source: Ambulatory Visit | Attending: Nurse Practitioner | Admitting: Nurse Practitioner

## 2015-04-15 ENCOUNTER — Other Ambulatory Visit: Payer: Self-pay | Admitting: Nurse Practitioner

## 2015-04-15 DIAGNOSIS — Z01419 Encounter for gynecological examination (general) (routine) without abnormal findings: Secondary | ICD-10-CM | POA: Diagnosis present

## 2015-04-15 DIAGNOSIS — Z113 Encounter for screening for infections with a predominantly sexual mode of transmission: Secondary | ICD-10-CM | POA: Insufficient documentation

## 2015-04-15 DIAGNOSIS — Z1151 Encounter for screening for human papillomavirus (HPV): Secondary | ICD-10-CM | POA: Diagnosis not present

## 2015-04-18 LAB — CYTOLOGY - PAP

## 2016-09-06 ENCOUNTER — Emergency Department (HOSPITAL_COMMUNITY)
Admission: EM | Admit: 2016-09-06 | Discharge: 2016-09-06 | Disposition: A | Discharge status: Home / Self Care | Payer: 59 | Attending: Emergency Medicine | Admitting: Emergency Medicine

## 2016-09-06 ENCOUNTER — Encounter (HOSPITAL_COMMUNITY): Payer: Self-pay | Admitting: Emergency Medicine

## 2016-09-06 ENCOUNTER — Emergency Department (HOSPITAL_COMMUNITY): Payer: 59

## 2016-09-06 DIAGNOSIS — Y929 Unspecified place or not applicable: Secondary | ICD-10-CM | POA: Diagnosis not present

## 2016-09-06 DIAGNOSIS — I10 Essential (primary) hypertension: Secondary | ICD-10-CM | POA: Diagnosis not present

## 2016-09-06 DIAGNOSIS — S93401A Sprain of unspecified ligament of right ankle, initial encounter: Secondary | ICD-10-CM | POA: Diagnosis not present

## 2016-09-06 DIAGNOSIS — X500XXA Overexertion from strenuous movement or load, initial encounter: Secondary | ICD-10-CM | POA: Diagnosis not present

## 2016-09-06 DIAGNOSIS — Y999 Unspecified external cause status: Secondary | ICD-10-CM | POA: Insufficient documentation

## 2016-09-06 DIAGNOSIS — M25561 Pain in right knee: Secondary | ICD-10-CM

## 2016-09-06 DIAGNOSIS — Z79899 Other long term (current) drug therapy: Secondary | ICD-10-CM | POA: Diagnosis not present

## 2016-09-06 DIAGNOSIS — W1830XA Fall on same level, unspecified, initial encounter: Secondary | ICD-10-CM | POA: Diagnosis not present

## 2016-09-06 DIAGNOSIS — S99911A Unspecified injury of right ankle, initial encounter: Secondary | ICD-10-CM | POA: Diagnosis present

## 2016-09-06 DIAGNOSIS — Y9301 Activity, walking, marching and hiking: Secondary | ICD-10-CM | POA: Insufficient documentation

## 2016-09-06 DIAGNOSIS — M25571 Pain in right ankle and joints of right foot: Secondary | ICD-10-CM

## 2016-09-06 NOTE — ED Provider Notes (Signed)
[facility]-EMERGENCY DEPT Provider Note   CSN: [id number] Arrival date & time: 09/06/16  1734     History   Chief Complaint Chief Complaint  Patient presents with  . Fall  . Ankle Injury    HPI Pierrette CLEOTHA WHALIN is a 41 y.o. female who presents today for evaluation after a mechanical fall/trip. She reports that she has pain mostly in her right ankle, minor pain in her right knee. She denies striking her head, no headache, neck pain, back pain. She reports that the injuries to her right leg are the only injury sustained. HPI  Past Medical History:  Diagnosis Date  . Anxiety   . Depression   . Hypertension     Patient Active Problem List   Diagnosis Date Noted  . Essential hypertension 02/26/2014  . Anxiety and depression 02/26/2014    Past Surgical History:  Procedure Laterality Date  . CESAREAN SECTION      OB History    No data available       Home Medications    Prior to Admission medications   Medication Sig Start Date End Date Taking? Authorizing Provider  citalopram (CELEXA) 10 MG tablet Take 1 tablet (10 mg total) by mouth daily. 02/26/14   Doris Cheadle, MD  diazepam (VALIUM) 5 MG tablet Take 1 tablet (5 mg total) by mouth 2 (two) times daily as needed for anxiety. 07/01/14   Trixie Dredge, PA-C  fluticasone (FLONASE) 50 MCG/ACT nasal spray Place 2 sprays into both nostrils daily. 05/20/14   Peyton Najjar, MD  ibuprofen (ADVIL,MOTRIN) 200 MG tablet Take 600 mg by mouth every 6 (six) hours as needed for headache.    [provider]  Multiple Vitamins-Minerals (MULTIVITAMIN WITH MINERALS) tablet Take 1 tablet by mouth daily.    [provider]  Omega-3 Fatty Acids (FISH OIL) 1000 MG CAPS Take 1,000 mg by mouth 3 (three) times daily.    [provider]  predniSONE (DELTASONE) 20 MG tablet Take 3 pills daily for 3 days 05/20/14   Peyton Najjar, MD    Family History Family History  Problem Relation Age of Onset  . Cancer Mother     cervical cancer   . Hypertension Maternal Grandmother   . Cancer Maternal Grandmother        lung cancer   . Hyperlipidemia Father   . Hypertension Father     Social History Social History  Substance Use Topics  . Smoking status: Never Smoker  . Smokeless tobacco: Not on file  . Alcohol use 0.0 oz/week     Comment: rarely     Allergies   Patient has no known allergies.   Review of Systems Review of Systems  Musculoskeletal: Positive for arthralgias, gait problem and joint swelling. Negative for back pain, neck pain and neck stiffness.  Skin: Negative for color change and wound.  Neurological: Negative for dizziness, light-headedness and headaches.     Physical Exam Updated Vital Signs BP (!) 174/99   Pulse (!) 105   Temp 99.1 F (37.3 C) (Oral)   Resp 18   Ht 5' 2.5" (1.588 m)   Wt 89.8 kg (198 lb)   SpO2 100%   BMI 35.64 kg/m   Physical Exam  Constitutional: She appears well-developed and well-nourished.  HENT:  Head: Normocephalic and atraumatic.  Musculoskeletal:  Obvious swelling and tenderness to palpation over right lateral malleolus. Foot is warm and well perfused with intact sensation and motor function. No obvious deformities.  Right  knee with mild lateral pain. No additional pain with valgus/varus stress or laxity. Normal anterior/posterior drawer test.  No tenderness to palpation over fibular head.  Right upper and lower leg compartments are soft, nontender.  Neurological: She is alert.  Normal tone, sensation to right leg  Skin: Skin is warm and dry. She is not diaphoretic.  Psychiatric: She has a normal mood and affect. Her behavior is normal.  Nursing note and vitals reviewed.    ED Treatments / Results  Labs (all labs ordered are listed, but only abnormal results are displayed) Labs Reviewed - No data to display  EKG  EKG Interpretation None       Radiology Dg Ankle Complete Right  Result Date: 09/06/2016 CLINICAL DATA:   41 year old female with fall and right ankle pain. EXAM: RIGHT ANKLE - COMPLETE 3+ VIEW COMPARISON:  None. FINDINGS: There is no acute fracture or dislocation. The bones are well mineralized. No arthritic changes. There is soft tissue swelling over the lateral malleolus. No radiopaque foreign object or soft tissue gas. IMPRESSION: 1. No acute fracture or dislocation. 2. Soft tissue swelling over the lateral malleolus. Electronically Signed   By: Elgie CollardArash  Radparvar M.D.   On: 09/06/2016 21:37   Dg Knee Complete 4 Views Right  Result Date: 09/06/2016 CLINICAL DATA:  Lateral right knee pain after fall prior to arrival. EXAM: RIGHT KNEE - COMPLETE 4+ VIEW COMPARISON:  None. FINDINGS: No evidence of fracture, dislocation, or joint effusion. The femorotibial and patellofemoral compartments are maintained. No evidence of arthropathy or other focal bone abnormality. Soft tissues are unremarkable. IMPRESSION: No acute fracture-dislocation of the right knee. Electronically Signed   By: Tollie Ethavid  Kwon M.D.   On: 09/06/2016 21:36    Procedures Procedures (including critical care time)  Medications Ordered in ED Medications - No data to display   Initial Impression / Assessment and Plan / ED Course  I have reviewed the triage vital signs and the nursing notes.  Pertinent labs & imaging results that were available during my care of the patient were reviewed by me and considered in my medical decision making (see chart for details).    Tamantha Wilfred LacyJ Wiatrek presents after a trip/stumble that was purely mechanical and occurred shortly PTA. She has right ankle pain and swelling consistent with a ankle sprain. She has some mild discomfort in her right knee without obvious ligamentous laxity or focal tenderness. Imaging was obtained and reviewed, no obvious evidence of acute osseous abnormality to knee or ankle. Instructed patient on home OTC pain medication, will give Ace wrap and crutches here, along with Ortho  follow-up.  Patient is agreeable to discharge, given the option to ask questions, all of which were answered to the best of my abilities.   Final Clinical Impressions(s) / ED Diagnoses   Final diagnoses:  Acute right ankle pain  Sprain of right ankle, unspecified ligament, initial encounter  Acute pain of right knee    New Prescriptions New Prescriptions   No medications on file     Norman ClayHammond, Arlinda Barcelona W, PA-C 09/07/16 [id number]    Pricilla LovelessGoldston, Scott, MD 09/07/16 [phone]

## 2016-09-06 NOTE — ED Notes (Signed)
Pt has c/o pain in right ankle and knee. Pt states she tripped earlier today. Pt states she is unable to put weight on right leg for that reason. Pt also states she quit taking her bp medication about 1 year ago.

## 2016-09-06 NOTE — Discharge Instructions (Signed)
Please take Ibuprofen (Advil, motrin) and Tylenol (acetaminophen) to relieve your pain.  You may take up to 800 MG (4 pills) of normal strength ibuprofen every 8 hours as needed.  In between doses of ibuprofen you make take tylenol, up to 1,000 mg (two extra strength pills).  Do not take more than 3,000 mg tylenol in a 24 hour period.  Please check all medication labels as many medications such as pain and cold medications may contain tylenol.  Do not drink alcohol while taking these medications.  Do not take other NSAID'S while taking ibuprofen (such as aleve or naproxen).  Please take ibuprofen with food to decrease stomach upset.  Please do not drive until your symptoms are fully resolved.   While in the ED your blood pressure was high.  Please follow up with your primary care doctor or the wellness clinic for repeat evaluation as you may need medication.  High blood pressure can cause long term, potentially serious, damage if left untreated.

## 2016-09-06 NOTE — ED Notes (Signed)
Pt to xray

## 2016-09-06 NOTE — ED Triage Notes (Signed)
Pt states that she was walking today, tripped and fell injuring her R ankle. Swelling noted. Alert and oriented.

## 2016-09-15 ENCOUNTER — Ambulatory Visit (INDEPENDENT_AMBULATORY_CARE_PROVIDER_SITE_OTHER): Payer: 59 | Admitting: Orthopaedic Surgery

## 2016-09-15 DIAGNOSIS — S93431A Sprain of tibiofibular ligament of right ankle, initial encounter: Secondary | ICD-10-CM | POA: Diagnosis not present
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
## 2016-10-06 ENCOUNTER — Ambulatory Visit (INDEPENDENT_AMBULATORY_CARE_PROVIDER_SITE_OTHER): Payer: 59 | Admitting: Orthopaedic Surgery

## 2018-03-26 IMAGING — CR DG KNEE COMPLETE 4+V*R*
4 series · 4 of 4 positions shown · non-contrast
Comparison: None.

CLINICAL DATA: Lateral right knee pain after fall prior to arrival.

EXAM:
RIGHT KNEE - COMPLETE 4+ VIEW

[t knee ap right]
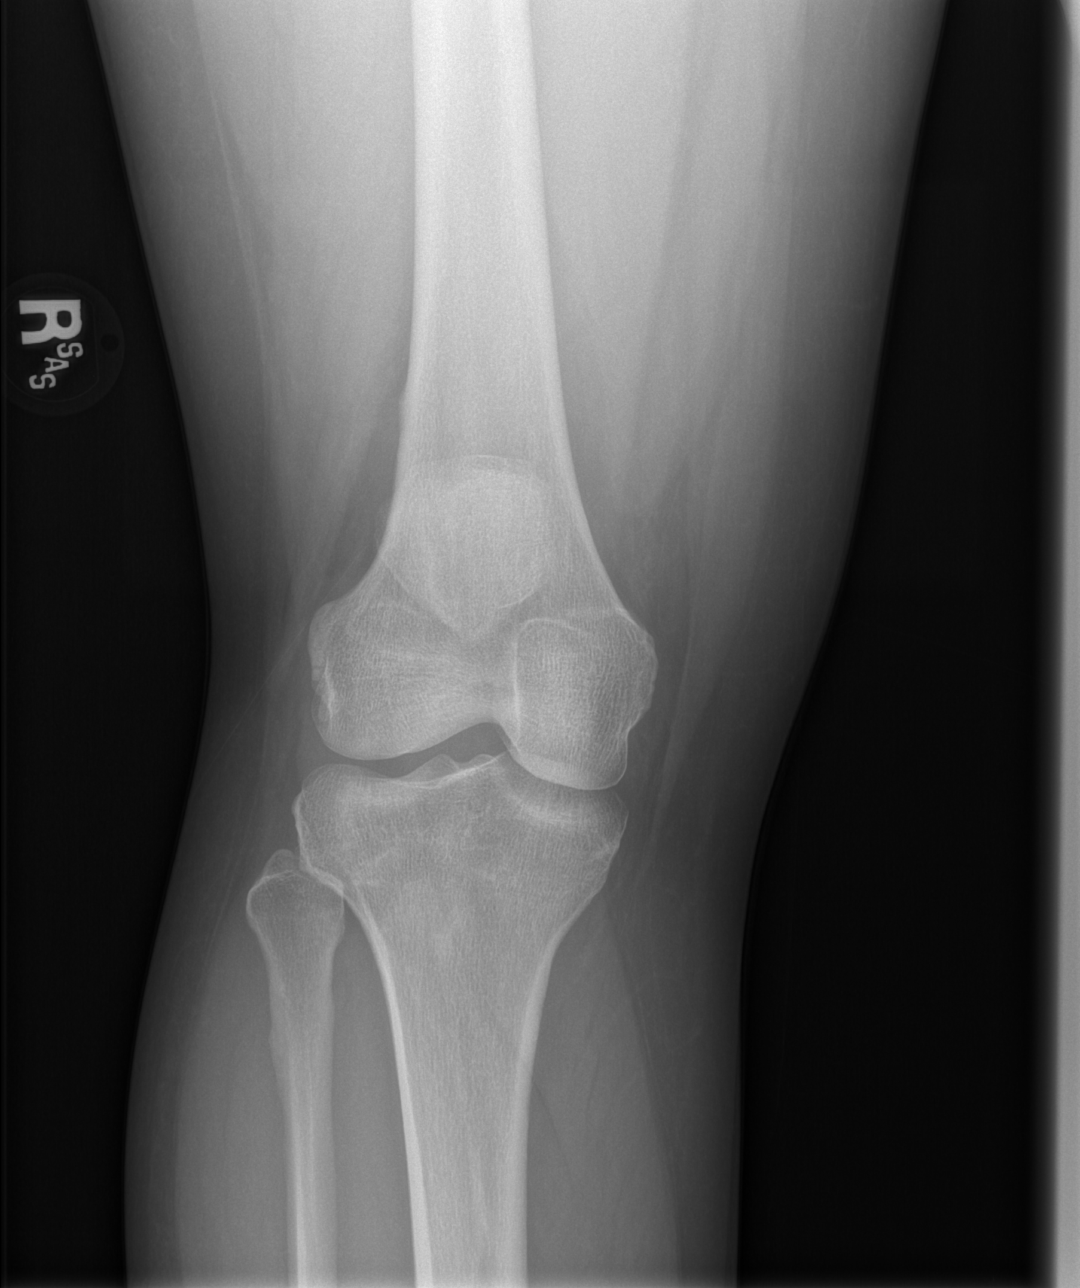

[t knee obl right (1 of 2)]
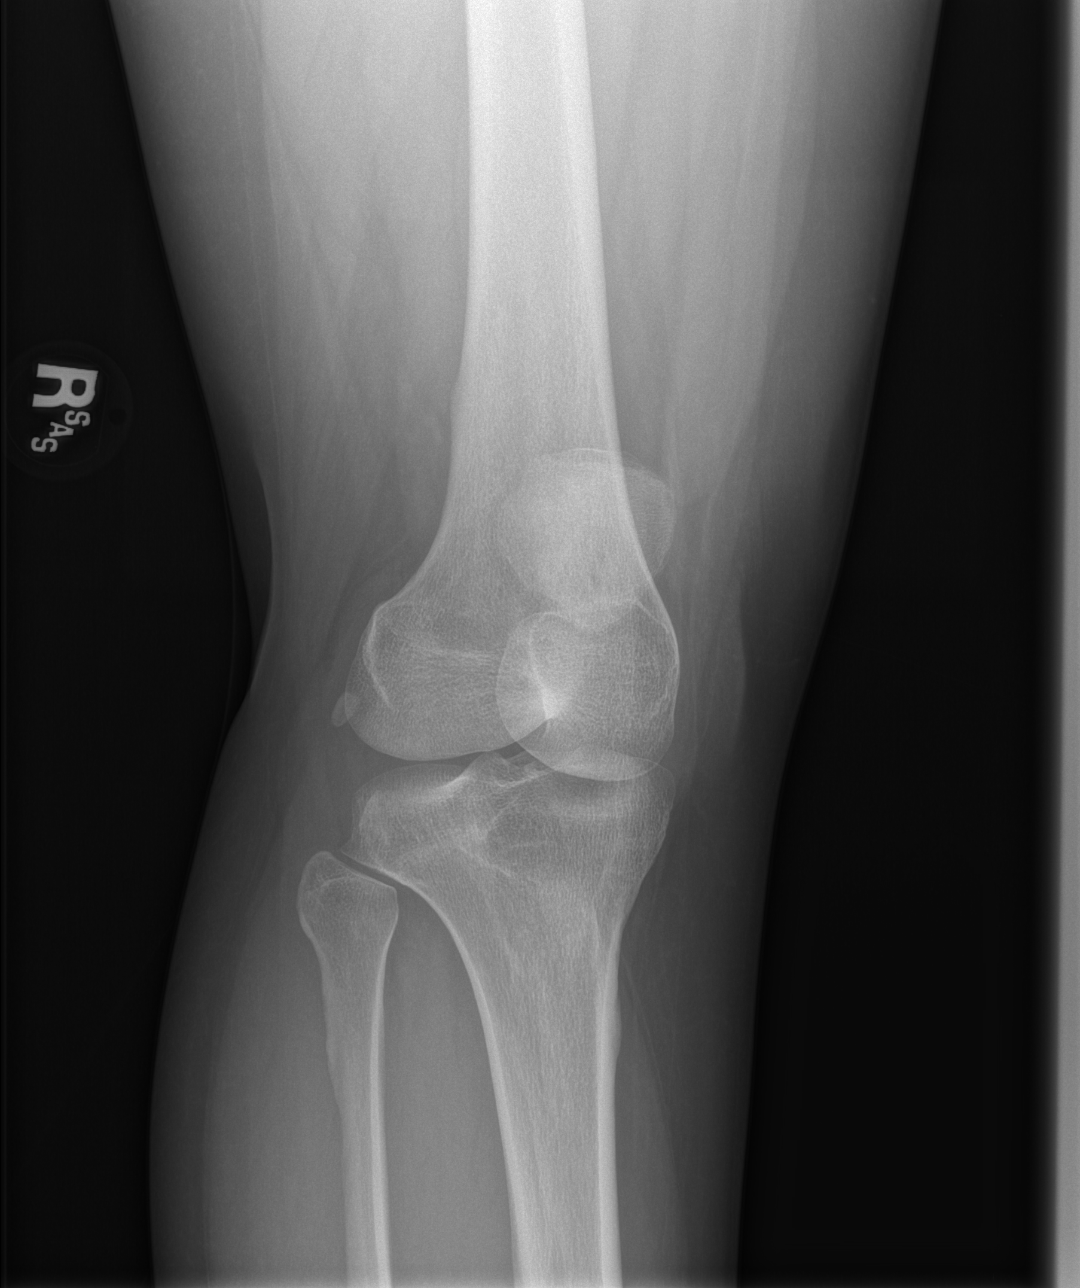

[t knee obl right (2 of 2)]
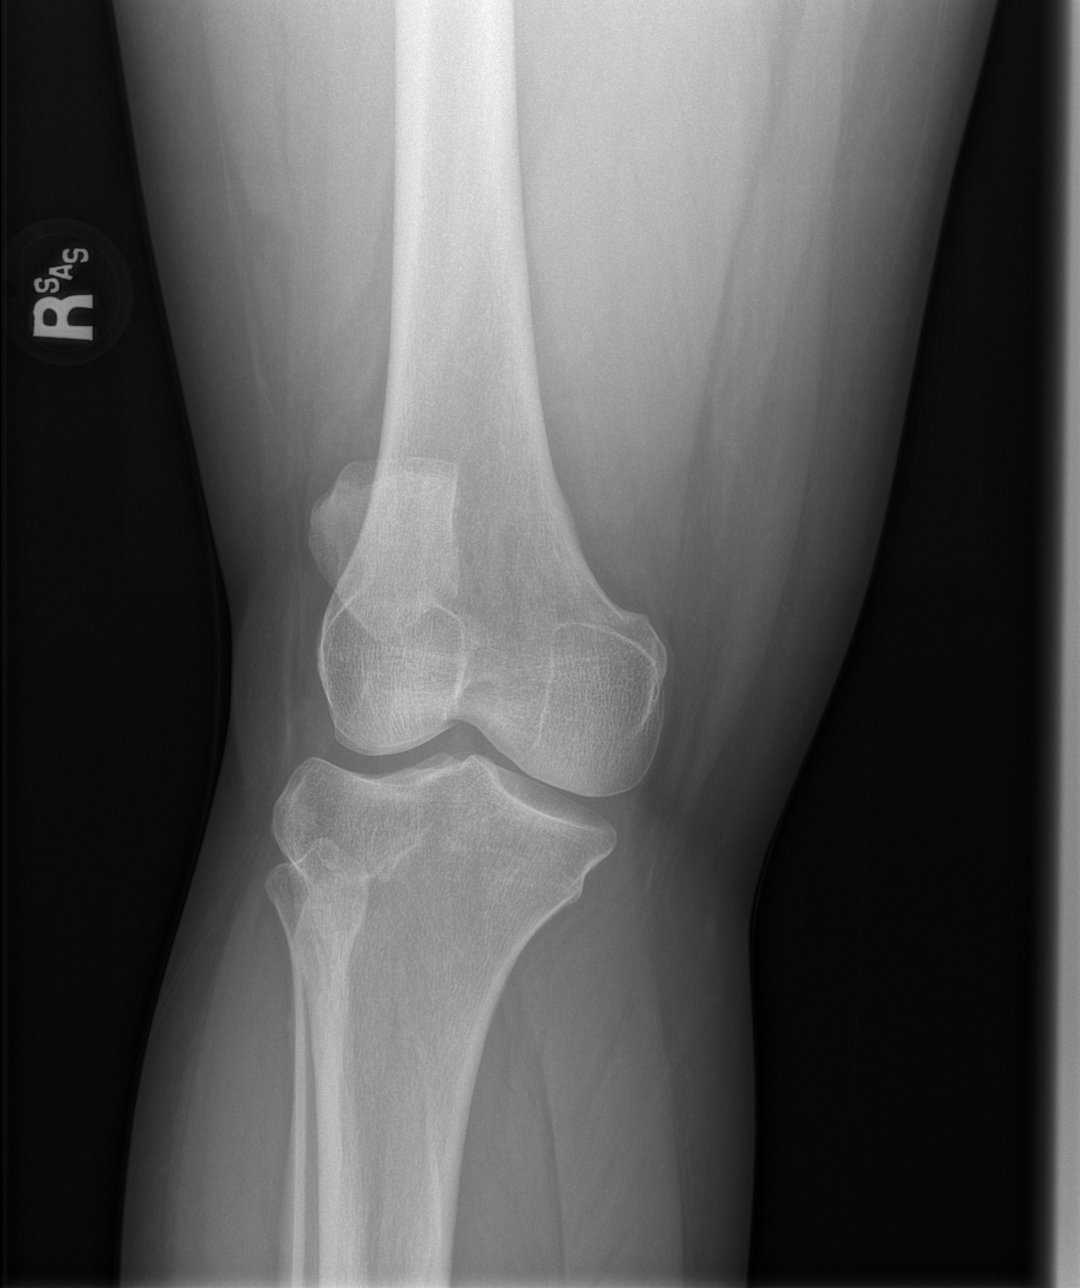

[t knee lat right]
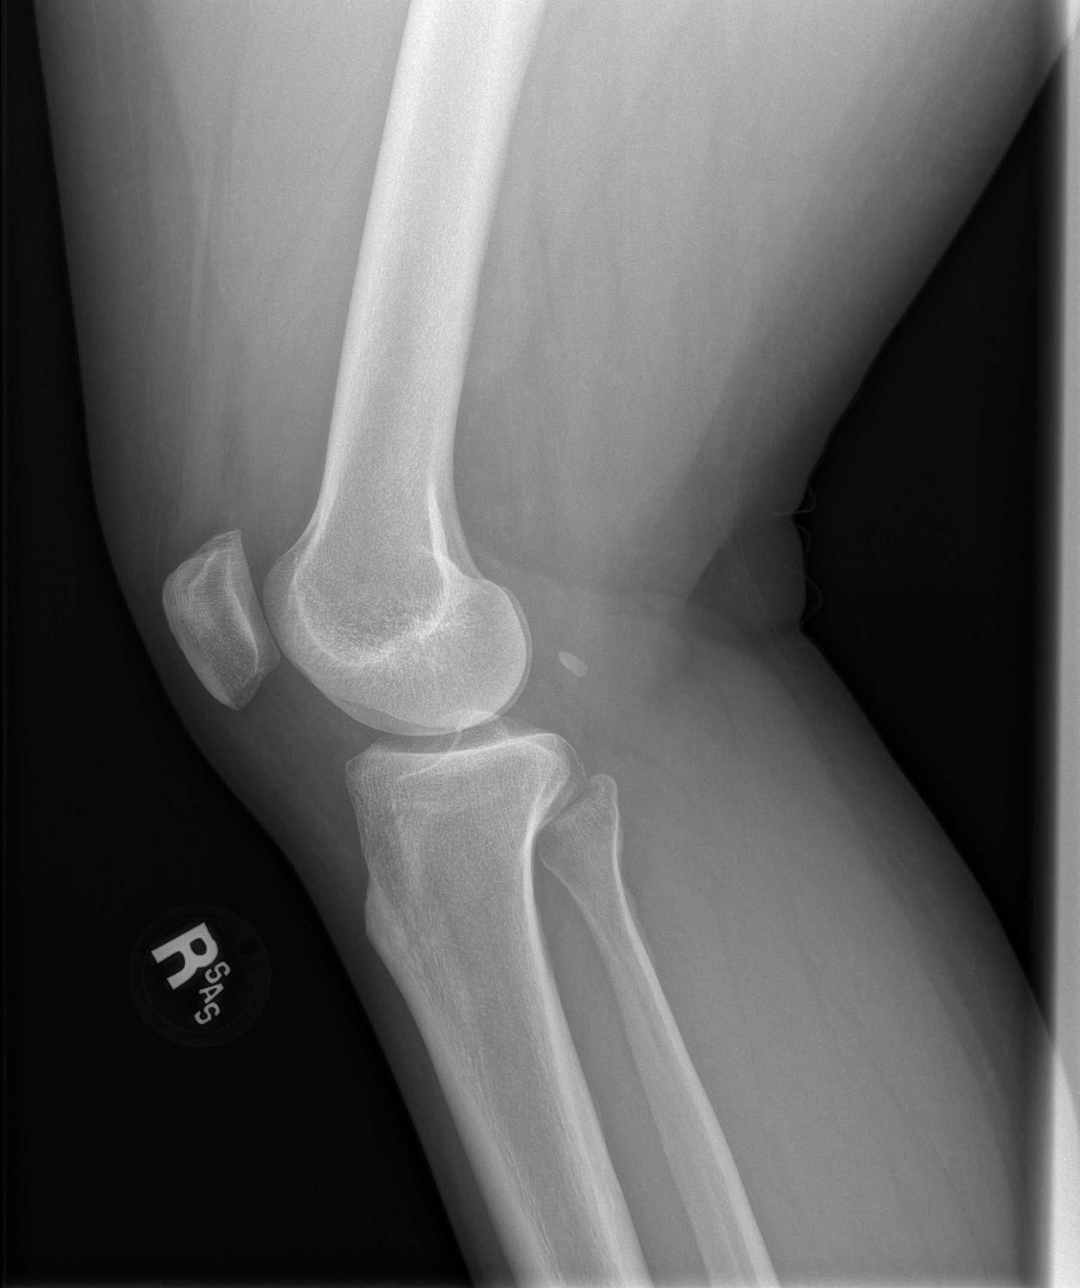

[4 of 4 positions shown; findings below may reference images not displayed]

FINDINGS: No evidence of fracture, dislocation, or joint effusion. The
femorotibial and patellofemoral compartments are maintained. No
evidence of arthropathy or other focal bone abnormality. Soft
tissues are unremarkable.
IMPRESSION: No acute fracture-dislocation of the right knee.
